# Patient Record
Sex: Male | Born: 2000 | Race: White | Hispanic: No | Marital: Single | Smoking: Never smoker
Health system: Southern US, Community
[De-identification: ages and names within clinical notes are randomized; demographics above are authoritative.]

## PROBLEM LIST (undated history)

## (undated) DIAGNOSIS — U071 COVID-19: Secondary | ICD-10-CM

## (undated) HISTORY — PX: APPENDECTOMY: SHX54

---
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
## 2023-03-21 NOTE — Progress Notes (Unsigned)
New patient visit  Patient: Marvin Cooke   DOB: 11/26/2000   22 y.o. Male  MRN: [id number] Visit Date: 03/25/2023  Today's healthcare provider: Debera Lat, PA-C   Chief Complaint  Patient presents with   New Patient (Initial Visit)    New patient , having issues concentrating , going for a couple month that he has notice    Subjective    Marvin Cooke is a 22 y.o. male who presents today as a new patient to establish care.  Moved back to [place], [place] form   Discussed the use of AI scribe software for clinical note transcription with the patient, who gave verbal consent to proceed.  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 He reports a history of ADHD diagnosed in childhood and depression diagnosed in 2021. He has previously been on medication for these conditions, including Atarax and Zoloft, but stopped taking them when he moved to [place]. He reports significant improvement in his mental state since then, stating that he no longer experiences the feelings and issues he had before. However, he acknowledges difficulty with concentration and fast-paced tasks at work, which he attributes to his ADHD. He denies any substance abuse or other medical problems.        03/25/2023   10:06 AM  PHQ9 SCORE ONLY  PHQ-9 Total Score 3      03/25/2023   11:01 AM  GAD 7 : Generalized Anxiety Score  Nervous, Anxious, on Edge 1  Control/stop worrying 0  Worry too much - different things 0  Trouble relaxing 1  Restless 0  Easily annoyed or irritable 0  Afraid - awful might happen 0  Total GAD 7 Score 2  Anxiety Difficulty Not difficult at all       Past Medical History:  Diagnosis Date   COVID-26 Sep 2019   Past Surgical History:  Procedure Laterality Date   APPENDECTOMY     Family Status  Relation Name Status   Mother  Alive   Father  Alive   Sister  Alive  No  partnership data on file   History reviewed. No pertinent family history. Social History   Socioeconomic History   Marital status: Single    Spouse name: Not on file   Number of children: Not on file   Years of education: Not on file   Highest education level: Not on file  Occupational History   Not on file  Tobacco Use   Smoking status: Never   Smokeless tobacco: Never  Vaping Use   Vaping status: Never Used  Substance and Sexual Activity   Alcohol use: Not Currently   Drug use: Not Currently   Sexual activity: Not Currently  Other Topics Concern   Not on file  Social History Narrative   Not on file   Social Determinants of Health   Financial Resource Strain: Not on file  Food Insecurity: Not on file  Transportation Needs: Not on file  Physical Activity: Not on file  Stress: Not on file  Social Connections: Not on file   Outpatient Medications Prior to Visit  Medication Sig   hydrOXYzine (ATARAX/VISTARIL) 25 MG tablet Take 1 tablet (25 mg total) by mouth at bedtime as needed and may repeat dose one time if needed for anxiety (insomnia.). (Patient not taking: Reported on 03/25/2023)   sertraline (ZOLOFT) 100 MG tablet Take 1 tablet (100  mg total) by mouth at bedtime. (Patient not taking: Reported on 03/25/2023)   No facility-administered medications prior to visit.   No Known Allergies  Immunization History  Administered Date(s) Administered   Tdap 09/19/2019    Health Maintenance  Topic Date Due   HPV VACCINES (1 - Male 3-dose series) Never done   HIV Screening  Never done   Hepatitis C Screening  Never done   COVID-19 Vaccine (1 - 2023-24 season) Never done   INFLUENZA VACCINE  04/08/2023   DTaP/Tdap/Td (2 - Td or Tdap) 09/18/2029    Patient Care Team: Clayborne Dana, MD as PCP - General (Pediatrics)  Review of Systems  All other systems reviewed and are negative.  Except see HPI   {Insert previous labs (optional):23779}  {See past labs  Heme   Chem  Endocrine  Serology  Results Review (optional):1}   Objective    BP 115/69   Pulse 76   Ht 5\' 7"  (1.702 m)   Wt 124 lb 8 oz (56.5 kg)   SpO2 99%   BMI 19.50 kg/m  {Insert last BP/Wt (optional):23777}  {See vitals history (optional):1}  Physical Exam Constitutional:      General: He is not in acute distress.    Appearance: Normal appearance. He is not diaphoretic.  HENT:     Head: Normocephalic.  Eyes:     Conjunctiva/sclera: Conjunctivae normal.  Pulmonary:     Effort: Pulmonary effort is normal. No respiratory distress.  Neurological:     Mental Status: He is alert and oriented to person, place, and time. Mental status is at baseline.     Depression Screen    03/25/2023   10:06 AM  PHQ 2/9 Scores  PHQ - 2 Score 0  PHQ- 9 Score 3   No results found for any visits on 03/25/23.  Assessment & Plan        Possible ADHD: Patient reports difficulty with concentration and fast-paced tasks. History of ADHD diagnosis in childhood and previous medication use. Unclear current status of ADHD. See ARSS survey -Refer to [facility] for further evaluation and possible medication management. -Refer to Psychology for specialized ADHD evaluation.  Depression: Chronic? Has a hx of involuntary commitment to hospital for suicidal ideations in 2021. Pt was diagnosed with MDD, MDDPatient reports improvement in mental health symptoms since last depression screen. No current depressive symptoms reported. -Monitor in follow-up.  General Health Maintenance: -Schedule physical exam in two weeks. -Discuss HPV vaccination at next visit. Patient to research and consider.     Possible developmental delay It required to explain more than 2 times every questions on questionnaire, reason for scheduling with psychology and psychiatry, how scheduling will work, how long it will take to be able to see specialist Needs to be assessed for developmental delay?  Encounter to establish  care Welcomed to our clinic Reviewed past medical hx, social hx, family hx and surgical hx Pt advised to send all vaccination records or screening   Return in about 2 weeks (around 04/08/2023) for CPE.    The patient was advised to call back or seek an in-person evaluation if the symptoms worsen or if the condition fails to improve as anticipated.  I discussed the assessment and treatment plan with the patient. The patient was provided an opportunity to ask questions and all were answered. The patient agreed with the plan and demonstrated an understanding of the instructions.  I, Debera Lat, PA-C have reviewed all documentation for this visit. The  documentation on  03/25/23  for the exam, diagnosis, procedures, and orders are all accurate and complete. I spent 45 Minutes caring for his patient today face to face, performing a medically appropriate examination and /or evaluation, counseling and educating the patient on Depression, Anxiety, ADHD, documenting in the record and referring to specialist while explaining why it should be necessary.   Debera Lat, [facility], MMS [facility] [phone] (phone) [phone] (fax)  [facility]

## 2023-03-25 ENCOUNTER — Encounter: Payer: Self-pay | Admitting: Physician Assistant

## 2023-03-25 ENCOUNTER — Ambulatory Visit: Payer: BC Managed Care – PPO | Admitting: Physician Assistant

## 2023-03-25 VITALS — BP 115/69 | HR 76 | Ht 67.0 in | Wt 124.5 lb

## 2023-03-25 DIAGNOSIS — F332 Major depressive disorder, recurrent severe without psychotic features: Secondary | ICD-10-CM | POA: Diagnosis not present

## 2023-03-25 DIAGNOSIS — Z7689 Persons encountering health services in other specified circumstances: Secondary | ICD-10-CM

## 2023-03-25 DIAGNOSIS — F419 Anxiety disorder, unspecified: Secondary | ICD-10-CM

## 2023-03-25 DIAGNOSIS — R41840 Attention and concentration deficit: Secondary | ICD-10-CM

## 2023-03-25 DIAGNOSIS — F3481 Disruptive mood dysregulation disorder: Secondary | ICD-10-CM

## 2023-03-25 DIAGNOSIS — R45851 Suicidal ideations: Secondary | ICD-10-CM

## 2023-03-26 ENCOUNTER — Telehealth: Payer: Self-pay

## 2023-03-26 NOTE — Telephone Encounter (Signed)
Called and lvm  detailed vm on pt's mother  regarding referral informed pt's mother that  referral was sent to [facility] regarding this. However if  she notices any of the weekend  to take him to ED  regarding this or to call 911.

## 2023-03-26 NOTE — Telephone Encounter (Signed)
Copied from CRM [id number]. Topic: General - Other >> Mar 26, 2023 11:49 AM Turkey B wrote: Reason for CRM: pt's mother called in about referral. I see notes about referring him to [facility] for [facility]. Will this also be for the suicidal thoughts also?  Please call back with status. She says [facility] never received the fax.

## 2023-03-27 DIAGNOSIS — R41840 Attention and concentration deficit: Secondary | ICD-10-CM | POA: Insufficient documentation

## 2023-03-27 DIAGNOSIS — F419 Anxiety disorder, unspecified: Secondary | ICD-10-CM | POA: Insufficient documentation

## 2023-04-08 ENCOUNTER — Encounter: Payer: Self-pay | Admitting: Physician Assistant

## 2023-04-08 DIAGNOSIS — F322 Major depressive disorder, single episode, severe without psychotic features: Secondary | ICD-10-CM | POA: Diagnosis not present

## 2023-04-14 DIAGNOSIS — F322 Major depressive disorder, single episode, severe without psychotic features: Secondary | ICD-10-CM | POA: Diagnosis not present

## 2023-04-14 DIAGNOSIS — F900 Attention-deficit hyperactivity disorder, predominantly inattentive type: Secondary | ICD-10-CM | POA: Diagnosis not present

## 2023-04-17 NOTE — Progress Notes (Unsigned)
Complete physical exam  Patient: Marvin Cooke   DOB: 2000-11-10   22 y.o. Male  MRN: [id number] Visit Date: 04/21/2023  Today's healthcare provider: Debera Lat, PA-C   No chief complaint on file.  Subjective    Marvin Cooke is a 22 y.o. male who presents today for a complete physical exam.  He reports consuming a {diet types:17450} diet. {Exercise:19826} He generally feels {well/fairly well/poorly:18703}. He reports sleeping {well/fairly well/poorly:18703}. He {does/does not:200015} have additional problems to discuss today.  HPI  *** Discussed the use of AI scribe software for clinical note transcription with the patient, who gave verbal consent to proceed.  History of Present Illness            Last depression screening scores    03/25/2023   10:06 AM  PHQ 2/9 Scores  PHQ - 2 Score 0  PHQ- 9 Score 3   Last fall risk screening    03/25/2023   10:01 AM  Fall Risk   Falls in the past year? 0  Number falls in past yr: 0  Injury with Fall? 0  Risk for fall due to : No Fall Risks  Follow up Falls prevention discussed;Falls evaluation completed   Last Audit-C alcohol use screening    11/15/2019   10:49 AM  Alcohol Use Disorder Test (AUDIT)  1. How often do you have a drink containing alcohol?   3. How often do you have six or more drinks on one occasion?   Alcohol Brief Interventions/Follow-up      Information is confidential and restricted. Go to Review Flowsheets to unlock data.   A score of 3 or more in women, and 4 or more in men indicates increased risk for alcohol abuse, EXCEPT if all of the points are from question 1   Past Medical History:  Diagnosis Date   COVID-26 Sep 2019   Past Surgical History:  Procedure Laterality Date   APPENDECTOMY     Social History   Socioeconomic History   Marital status: Single    Spouse name: Not on file   Number of children: Not on file   Years of education: Not on file   Highest education level:  Not on file  Occupational History   Not on file  Tobacco Use   Smoking status: Never   Smokeless tobacco: Never  Vaping Use   Vaping status: Never Used  Substance and Sexual Activity   Alcohol use: Not Currently   Drug use: Not Currently   Sexual activity: Not Currently  Other Topics Concern   Not on file  Social History Narrative   Not on file   Social Determinants of Health   Financial Resource Strain: Not on file  Food Insecurity: Not on file  Transportation Needs: Not on file  Physical Activity: Not on file  Stress: Not on file  Social Connections: Not on file  Intimate Partner Violence: Not on file   Family Status  Relation Name Status   Mother  Alive   Father  Alive   Sister  Alive  No partnership data on file   No family history on file. No Known Allergies  Patient Care Team: Debera Lat, PA-C as PCP - General (Physician Assistant)   Medications: Outpatient Medications Prior to Visit  Medication Sig   hydrOXYzine (ATARAX/VISTARIL) 25 MG tablet Take 1 tablet (25 mg total) by mouth at bedtime as needed and may repeat dose one time if needed for anxiety (insomnia.). (  Patient not taking: Reported on 03/25/2023)   sertraline (ZOLOFT) 100 MG tablet Take 1 tablet (100 mg total) by mouth at bedtime. (Patient not taking: Reported on 03/25/2023)   No facility-administered medications prior to visit.    Review of Systems  All other systems reviewed and are negative.  Except see HPI  {Insert previous labs (optional):23779} {See past labs  Heme  Chem  Endocrine  Serology  Results Review (optional):1}  Objective    There were no vitals taken for this visit. {Insert last BP/Wt (optional):23777}{See vitals history (optional):1}    Physical Exam Vitals reviewed.  Constitutional:      General: He is not in acute distress.    Appearance: Normal appearance. He is well-developed. He is not ill-appearing, toxic-appearing or diaphoretic.  HENT:     Head:  Normocephalic and atraumatic.     Right Ear: Tympanic membrane, ear canal and external ear normal.     Left Ear: Tympanic membrane, ear canal and external ear normal.     Nose: Nose normal. No congestion or rhinorrhea.     Mouth/Throat:     Mouth: Mucous membranes are moist.     Pharynx: Oropharynx is clear. No oropharyngeal exudate.  Eyes:     General: No scleral icterus.       Right eye: No discharge.        Left eye: No discharge.     Conjunctiva/sclera: Conjunctivae normal.     Pupils: Pupils are equal, round, and reactive to light.  Neck:     Thyroid: No thyromegaly.     Vascular: No carotid bruit.  Cardiovascular:     Rate and Rhythm: Normal rate and regular rhythm.     Pulses: Normal pulses.     Heart sounds: Normal heart sounds. No murmur heard.    No friction rub. No gallop.  Pulmonary:     Effort: Pulmonary effort is normal. No respiratory distress.     Breath sounds: Normal breath sounds. No wheezing or rales.  Abdominal:     General: Abdomen is flat. Bowel sounds are normal. There is no distension.     Palpations: Abdomen is soft. There is no mass.     Tenderness: There is no abdominal tenderness. There is no right CVA tenderness, left CVA tenderness, guarding or rebound.     Hernia: No hernia is present.  Musculoskeletal:        General: No swelling, tenderness, deformity or signs of injury. Normal range of motion.     Cervical back: Normal range of motion and neck supple. No rigidity or tenderness.     Right lower leg: No edema.     Left lower leg: No edema.  Lymphadenopathy:     Cervical: No cervical adenopathy.  Skin:    General: Skin is warm and dry.     Coloration: Skin is not jaundiced or pale.     Findings: No bruising, erythema, lesion or rash.  Neurological:     Mental Status: He is alert and oriented to person, place, and time. Mental status is at baseline.     Gait: Gait normal.  Psychiatric:        Mood and Affect: Mood normal.        Behavior:  Behavior normal.        Thought Content: Thought content normal.        Judgment: Judgment normal.      No results found for any visits on 04/21/23.  Assessment & Plan  Routine Health Maintenance and Physical Exam  Exercise Activities and Dietary recommendations  Goals   None     Immunization History  Administered Date(s) Administered   Tdap 09/19/2019    Health Maintenance  Topic Date Due   HPV Vaccine (1 - Male 3-dose series) Never done   HIV Screening  Never done   Hepatitis C Screening  Never done   COVID-19 Vaccine (1 - 2023-24 season) Never done   Flu Shot  04/08/2023   DTaP/Tdap/Td vaccine (2 - Td or Tdap) 09/18/2029    Discussed health benefits of physical activity, and encouraged him to engage in regular exercise appropriate for his age and condition. Annual physical exam UTD on dental/eye Things to do to keep yourself healthy  - Exercise at least 30-45 minutes a day, 3-4 days a week.  - Eat a low-fat diet with lots of fruits and vegetables, up to 7-9 servings per day.  - Seatbelts can save your life. Wear them always.  - Smoke detectors on every level of your home, check batteries every year.  - Eye Doctor - have an eye exam every 1-2 years  - Safe sex - if you may be exposed to STDs, use a condom.  - Alcohol -  If you drink, do it moderately, less than 2 drinks per day.  - Health Care Power of Attorney. Choose someone to speak for you if you are not able.  - Depression is common in our stressful world.If you're feeling down or losing interest in things you normally enjoy, please come in for a visit.  - Violence - If anyone is threatening or hurting you, please call immediately.   ***  No follow-ups on file.    The patient was advised to call back or seek an in-person evaluation if the symptoms worsen or if the condition fails to improve as anticipated.  I discussed the assessment and treatment plan with the patient. The patient was provided an  opportunity to ask questions and all were answered. The patient agreed with the plan and demonstrated an understanding of the instructions.  I, Debera Lat, PA-C have reviewed all documentation for this visit. The documentation on  04/21/23  for the exam, diagnosis, procedures, and orders are all accurate and complete.  Debera Lat, [facility], MMS [facility] [phone] (phone) [phone] (fax)  [facility]

## 2023-04-21 ENCOUNTER — Encounter: Payer: Self-pay | Admitting: Physician Assistant

## 2023-04-21 ENCOUNTER — Ambulatory Visit (INDEPENDENT_AMBULATORY_CARE_PROVIDER_SITE_OTHER): Payer: BC Managed Care – PPO | Admitting: Physician Assistant

## 2023-04-21 VITALS — HR 83 | Temp 98.0°F | Resp 12 | Ht 67.0 in | Wt 125.6 lb

## 2023-04-21 DIAGNOSIS — Z0000 Encounter for general adult medical examination without abnormal findings: Secondary | ICD-10-CM

## 2023-04-29 DIAGNOSIS — F411 Generalized anxiety disorder: Secondary | ICD-10-CM | POA: Diagnosis not present

## 2023-04-29 DIAGNOSIS — F322 Major depressive disorder, single episode, severe without psychotic features: Secondary | ICD-10-CM | POA: Diagnosis not present

## 2023-04-29 DIAGNOSIS — F900 Attention-deficit hyperactivity disorder, predominantly inattentive type: Secondary | ICD-10-CM | POA: Diagnosis not present

## 2023-06-07 ENCOUNTER — Encounter: Payer: Self-pay | Admitting: Physician Assistant

## 2023-06-08 ENCOUNTER — Encounter: Payer: Self-pay | Admitting: Physician Assistant

## 2023-06-08 ENCOUNTER — Telehealth (INDEPENDENT_AMBULATORY_CARE_PROVIDER_SITE_OTHER): Payer: BC Managed Care – PPO | Admitting: Physician Assistant

## 2023-06-08 DIAGNOSIS — R631 Polydipsia: Secondary | ICD-10-CM

## 2023-06-08 DIAGNOSIS — R350 Frequency of micturition: Secondary | ICD-10-CM | POA: Diagnosis not present

## 2023-06-08 DIAGNOSIS — N3941 Urge incontinence: Secondary | ICD-10-CM | POA: Diagnosis not present

## 2023-06-08 NOTE — Progress Notes (Signed)
MyChart Video Visit  Virtual Visit via Video Note   This format is felt to be most appropriate for this patient at this time. Physical exam was limited by quality of the video and audio technology used for the visit.   Patient location: office Patient Location: Home  I discussed the limitations of evaluation and management by telemedicine and the availability of in person appointments. The patient expressed understanding and agreed to proceed.  Patient: Marvin Cooke   DOB: 2000-12-30   22 y.o. Male  MRN: [id number] Visit Date: 06/08/2023  Today's healthcare provider: Debera Lat, PA-C   Chief Complaint  Patient presents with   Acute Visit   Subjective     Discussed the use of AI scribe software for clinical note transcription with the patient, who gave verbal consent to proceed.  History of Present Illness   The patient, with a history of depression, presents with a two-week history of increased thirst and frequent urination. He denies a family history of diabetes and a diet high in sugar. He denies dysuria and abdominal pain. He has been working out more frequently at the gym and drinking more water as a result. He reports urinary incontinence, specifically an inability to reach the bathroom in time. He denies cough-induced leakage. He denies fever, flank pain, hematuria, abnormal urine color, discharge, and rash in the groin area. He has been under increased stress at work.      Medications: No outpatient medications prior to visit.   No facility-administered medications prior to visit.    Review of Systems  All other systems reviewed and are negative.  Except see HPI       Objective    There were no vitals taken for this visit.      Physical Exam Constitutional:      General: He is not in acute distress.    Appearance: Normal appearance. He is not diaphoretic.  HENT:     Head: Normocephalic.  Eyes:     Conjunctiva/sclera: Conjunctivae normal.   Pulmonary:     Effort: Pulmonary effort is normal. No respiratory distress.  Neurological:     Mental Status: He is alert and oriented to person, place, and time. Mental status is at baseline.        Assessment & Plan    Increased urinary frequency  Increased thirst Polydipsia and Polyuria Increased water intake and urinary frequency for the past two weeks. No family history of diabetes, no recent changes in diet or medication, no pain or burning with urination, no fever, no abdominal pain. Patient has been exercising more recently. -Order urinalysis and urine culture to rule out urinary tract infection. -Order blood work including fasting blood glucose and HbA1c to rule out diabetes. -Check kidney function. - Comprehensive metabolic panel - Urine Culture - Hemoglobin A1c - Urinalysis, Routine w reflex microscopic     Urge incontinence of urine Acute problem Unclear if it is due to increased stress? Patient reports occasional leakage, particularly when unable to reach the bathroom in time. -If labs are normal, consider referral to urology for further evaluation.  Stress Patient reports stress related to work. -Continue to monitor and provide support as needed.    Recheck his phq9 and gad7 at his next visit after checking his lab results   No follow-ups on file.     I discussed the assessment and treatment plan with the patient. The patient was provided an opportunity to ask questions and all were answered. The patient agreed with  the plan and demonstrated an understanding of the instructions.   The patient was advised to call back or seek an in-person evaluation if the symptoms worsen or if the condition fails to improve as anticipated.  I provided 21 minutes of non-face-to-face time during this encounter.  I, Debera Lat, PA-C have reviewed all documentation for this visit. The documentation on  06/08/23 for the exam, diagnosis, procedures, and orders are all accurate  and complete.  Debera Lat, [facility], MMS [facility] [phone] (phone) [phone] (fax)  [facility]

## 2023-06-09 DIAGNOSIS — R631 Polydipsia: Secondary | ICD-10-CM | POA: Diagnosis not present

## 2023-06-09 DIAGNOSIS — R350 Frequency of micturition: Secondary | ICD-10-CM | POA: Diagnosis not present

## 2023-06-09 DIAGNOSIS — N3941 Urge incontinence: Secondary | ICD-10-CM | POA: Diagnosis not present

## 2023-06-11 LAB — URINALYSIS, ROUTINE W REFLEX MICROSCOPIC
Bilirubin, UA: NEGATIVE
Glucose, UA: NEGATIVE
Ketones, UA: NEGATIVE
Leukocytes,UA: NEGATIVE
Nitrite, UA: NEGATIVE
Protein,UA: NEGATIVE
RBC, UA: NEGATIVE
Specific Gravity, UA: 1.012 (ref 1.005–1.030)
Urobilinogen, Ur: 0.2 mg/dL (ref 0.2–1.0)
pH, UA: 6 (ref 5.0–7.5)

## 2023-06-11 LAB — COMPREHENSIVE METABOLIC PANEL
ALT: 87 [IU]/L — ABNORMAL HIGH (ref 0–44)
AST: 114 [IU]/L — ABNORMAL HIGH (ref 0–40)
Albumin: 4.5 g/dL (ref 4.3–5.2)
Alkaline Phosphatase: 67 [IU]/L (ref 51–125)
BUN/Creatinine Ratio: 17 (ref 9–20)
BUN: 17 mg/dL (ref 6–20)
Bilirubin Total: 0.7 mg/dL (ref 0.0–1.2)
CO2: 25 mmol/L (ref 20–29)
Calcium: 9.5 mg/dL (ref 8.7–10.2)
Chloride: 99 mmol/L (ref 96–106)
Creatinine, Ser: 1.01 mg/dL (ref 0.76–1.27)
Globulin, Total: 2.4 g/dL (ref 1.5–4.5)
Glucose: 83 mg/dL (ref 70–99)
Potassium: 4.5 mmol/L (ref 3.5–5.2)
Sodium: 138 mmol/L (ref 134–144)
Total Protein: 6.9 g/dL (ref 6.0–8.5)
eGFR: 109 mL/min/{1.73_m2} (ref >59)

## 2023-06-11 LAB — URINE CULTURE: Organism ID, Bacteria: NO GROWTH

## 2023-06-11 LAB — HEMOGLOBIN A1C
Est. average glucose Bld gHb Est-mCnc: 100 mg/dL
Hgb A1c MFr Bld: 5.1 % (ref 4.8–5.6)

## 2023-06-18 ENCOUNTER — Encounter: Payer: Self-pay | Admitting: Physician Assistant

## 2023-06-18 DIAGNOSIS — F900 Attention-deficit hyperactivity disorder, predominantly inattentive type: Secondary | ICD-10-CM | POA: Diagnosis not present

## 2023-06-18 DIAGNOSIS — F322 Major depressive disorder, single episode, severe without psychotic features: Secondary | ICD-10-CM | POA: Diagnosis not present

## 2023-06-18 DIAGNOSIS — F411 Generalized anxiety disorder: Secondary | ICD-10-CM | POA: Diagnosis not present

## 2023-08-02 DIAGNOSIS — F411 Generalized anxiety disorder: Secondary | ICD-10-CM | POA: Diagnosis not present

## 2023-08-02 DIAGNOSIS — F322 Major depressive disorder, single episode, severe without psychotic features: Secondary | ICD-10-CM | POA: Diagnosis not present

## 2023-08-02 DIAGNOSIS — F900 Attention-deficit hyperactivity disorder, predominantly inattentive type: Secondary | ICD-10-CM | POA: Diagnosis not present

## 2023-09-28 DIAGNOSIS — F322 Major depressive disorder, single episode, severe without psychotic features: Secondary | ICD-10-CM | POA: Diagnosis not present

## 2023-09-28 DIAGNOSIS — F411 Generalized anxiety disorder: Secondary | ICD-10-CM | POA: Diagnosis not present

## 2023-09-28 DIAGNOSIS — F900 Attention-deficit hyperactivity disorder, predominantly inattentive type: Secondary | ICD-10-CM | POA: Diagnosis not present

## 2023-10-15 ENCOUNTER — Ambulatory Visit: Payer: BC Managed Care – PPO | Admitting: Physician Assistant

## 2023-10-15 ENCOUNTER — Encounter: Payer: Self-pay | Admitting: Physician Assistant

## 2023-10-15 VITALS — BP 118/62 | HR 96 | Wt 135.9 lb

## 2023-10-15 DIAGNOSIS — R051 Acute cough: Secondary | ICD-10-CM | POA: Diagnosis not present

## 2023-10-15 NOTE — Progress Notes (Signed)
 Established patient visit  Patient: Marvin Cooke   DOB: 12/02/2000   23 y.o. Male  MRN: [id number] Visit Date: 10/15/2023  Today's healthcare provider: Jolynn Spencer, PA-C   Chief Complaint  Patient presents with   Cough    Symptoms started Tuesday night as severe hiccups, sore throat, chills and cough with phlegm. Patient reports phlegm is yellowish-green color. Not aware of any fever. Reports he has also had a headache for the last few day.  He reports taking mucinex and Nyquil. Patient reports he has not tested for covid at home.  Reports main symptom is his headache and chills   Subjective       Discussed the use of AI scribe software for clinical note transcription with the patient, who gave verbal consent to proceed.  History of Present Illness   The patient presents with a 4-day history of severe hiccups, sore throat, chills, cough with phlegm, and headache. He has been self-treating with Mucinex DM and Nightquil. He denies fever and ear problems. He reports a recent negative COVID test. The patient's main symptoms at the time of the visit are cough and mucus production. He also reports a painful finger due to constant use.           04/21/2023    4:09 PM 04/21/2023    4:08 PM 03/25/2023   10:06 AM  Depression screen PHQ 2/9  Decreased Interest 0 0 0  Down, Depressed, Hopeless 0 0 0  PHQ - 2 Score 0 0 0  Altered sleeping 0 0 0  Tired, decreased energy 0 0 0  Change in appetite 0 0 0  Feeling bad or failure about yourself  0    Trouble concentrating 1 1 3   Moving slowly or fidgety/restless 0 0 0  Suicidal thoughts 0 0 0  PHQ-9 Score 1 1 3   Difficult doing work/chores Not difficult at all Not difficult at all Somewhat difficult      03/25/2023   11:01 AM  GAD 7 : Generalized Anxiety Score  Nervous, Anxious, on Edge 1  Control/stop worrying 0  Worry too much - different things 0  Trouble relaxing 1  Restless 0  Easily annoyed or irritable 0  Afraid - awful  might happen 0  Total GAD 7 Score 2  Anxiety Difficulty Not difficult at all    Medications: No outpatient medications prior to visit.   No facility-administered medications prior to visit.    Review of Systems All negative Except see HPI       Objective    BP 118/62 (BP Location: Right Arm, Patient Position: Sitting, Cuff Size: Normal)   Pulse 96   Wt 135 lb 14.4 oz (61.6 kg)   SpO2 96%   BMI 21.28 kg/m     Physical Exam Vitals reviewed.  Constitutional:      General: He is in acute distress.     Appearance: Normal appearance. He is not ill-appearing, toxic-appearing or diaphoretic.  HENT:     Head: Normocephalic and atraumatic.     Right Ear: Ear canal and external ear normal. There is impacted cerumen (partially).     Left Ear: Ear canal and external ear normal. There is impacted cerumen (partially).     Nose: Congestion and rhinorrhea present.     Mouth/Throat:     Pharynx: Posterior oropharyngeal erythema present.  Eyes:     General: No scleral icterus.       Right eye: No discharge.  Left eye: No discharge.     Extraocular Movements: Extraocular movements intact.     Conjunctiva/sclera: Conjunctivae normal.     Pupils: Pupils are equal, round, and reactive to light.  Cardiovascular:     Rate and Rhythm: Normal rate and regular rhythm.     Pulses: Normal pulses.     Heart sounds: Normal heart sounds. No murmur heard. Pulmonary:     Effort: Pulmonary effort is normal. No respiratory distress.     Breath sounds: Normal breath sounds. No wheezing or rhonchi.  Abdominal:     General: Abdomen is flat. Bowel sounds are normal.     Palpations: Abdomen is soft.  Musculoskeletal:        General: Normal range of motion.     Cervical back: Normal range of motion and neck supple.     Right lower leg: No edema.     Left lower leg: No edema.  Lymphadenopathy:     Cervical: No cervical adenopathy.  Skin:    General: Skin is warm and dry.     Findings: No  rash.  Neurological:     General: No focal deficit present.     Mental Status: He is alert and oriented to person, place, and time. Mental status is at baseline.  Psychiatric:        Behavior: Behavior normal.        Thought Content: Thought content normal.      No results found for any visits on 10/15/23.      Assessment and Plan    Upper Respiratory Infection Symptoms of cough with phlegm, sore throat, chills, and headache for 4 days. No fever. COVID-19 test negative. Lungs clear on auscultation. -Continue Mucinex DM for cough. -Use nasal saline rinse for nasal congestion. -Consider Allegra, Claritin, or Zyrtec for antihistamine. -Consider Flonase if nasal congestion worsens. -Use warm salt gargle for sore throat. -Drink plenty of water and hot tea with honey. -Use Tylenol or ibuprofen with meals for fever or chills. -If symptoms worsen, contact primary care provider for further evaluation and possible prescription medication.  Follow-up Monitor symptoms and contact primary care provider if symptoms worsen.     Orders Placed This Encounter  Procedures   POC Covid19/Flu A&B Antigen    No follow-ups on file.   The patient was advised to call back or seek an in-person evaluation if the symptoms worsen or if the condition fails to improve as anticipated.  I discussed the assessment and treatment plan with the patient. The patient was provided an opportunity to ask questions and all were answered. The patient agreed with the plan and demonstrated an understanding of the instructions.  I, Arkie Tagliaferro, PA-C have reviewed all documentation for this visit. The documentation on 10/15/2023  for the exam, diagnosis, procedures, and orders are all accurate and complete.  Jolynn Spencer, [facility], MMS [facility] [phone] (phone) [phone] (fax)  [facility]

## 2023-10-16 LAB — POC COVID19/FLU A&B COMBO

## 2024-02-10 DIAGNOSIS — H9191 Unspecified hearing loss, right ear: Secondary | ICD-10-CM | POA: Diagnosis not present

## 2024-02-10 DIAGNOSIS — H6121 Impacted cerumen, right ear: Secondary | ICD-10-CM | POA: Diagnosis not present

## 2024-04-15 NOTE — Progress Notes (Signed)
 Complete physical exam  Patient: Marvin Cooke   DOB: 03-28-2001   23 y.o. Male  MRN: [id number] Visit Date: 04/21/2024  Today's healthcare provider: Jolynn Spencer, PA-C   Chief Complaint  Patient presents with   Annual Exam    Diet - General Exercise - when time allows for at least a hour but no set regimen Feeling - well Sleeping - well Concerns - none   Subjective    Marvin Cooke is a 23 y.o. male who presents today for a complete physical exam.   HPI     Annual Exam    Additional comments: Diet - General Exercise - when time allows for at least a hour but no set regimen Feeling - well Sleeping - well Concerns - none      Last edited by Lilian Fitzpatrick, CMA on 04/21/2024  9:26 AM.      Discussed the use of AI scribe software for clinical note transcription with the patient, who gave verbal consent to proceed.  History of Present Illness Marvin Cooke is a 23 year old male who presents with allergy symptoms and an annual physical exam .  He experiences nasal congestion and mucus drainage since returning from the beach. He uses over-the-counter medications like Decara and plans to take DayQuil. No ear pain, hearing issues, or throat lumps are present.  A year ago, he underwent an ADHD evaluation with positive results. He tried medication without benefit and discontinued it. He experiences recent work-related anxiety but considers it normal. He is considering autism testing at a facility in [place] and plans to send records to his medical provider.  He is not comfortable with vaccinations unless necessary and has not received any recent vaccines. He sells plasma and undergoes regular blood work, including blood pressure and finger pricks, during these sessions.    Last depression screening scores    04/21/2023    4:09 PM 04/21/2023    4:08 PM 03/25/2023   10:06 AM  PHQ 2/9 Scores  PHQ - 2 Score 0 0 0  PHQ- 9 Score 1 1 3    Last fall risk screening     04/21/2023    4:08 PM  Fall Risk   Falls in the past year? 0  Number falls in past yr: 0  Injury with Fall? 0  Risk for fall due to : No Fall Risks  Follow up Falls evaluation completed   Last Audit-C alcohol use screening    11/15/2019   10:49 AM  Alcohol Use Disorder Test (AUDIT)  1. How often do you have a drink containing alcohol?   3. How often do you have six or more drinks on one occasion?   Alcohol Brief Interventions/Follow-up      Information is confidential and restricted. Go to Review Flowsheets to unlock data.   A score of 3 or more in women, and 4 or more in men indicates increased risk for alcohol abuse, EXCEPT if all of the points are from question 1   Past Medical History:  Diagnosis Date   COVID-26 Sep 2019   Past Surgical History:  Procedure Laterality Date   APPENDECTOMY     Social History   Socioeconomic History   Marital status: Single    Spouse name: Not on file   Number of children: Not on file   Years of education: Not on file   Highest education level: Not on file  Occupational History   Not on file  Tobacco Use   Smoking status: Never   Smokeless tobacco: Never  Vaping Use   Vaping status: Never Used  Substance and Sexual Activity   Alcohol use: Not Currently   Drug use: Not Currently   Sexual activity: Not Currently  Other Topics Concern   Not on file  Social History Narrative   Not on file   Social Drivers of Health   Financial Resource Strain: Not on file  Food Insecurity: Not on file  Transportation Needs: Not on file  Physical Activity: Not on file  Stress: Not on file  Social Connections: Not on file  Intimate Partner Violence: Not on file   Family Status  Relation Name Status   Mother  Alive   Father  Alive   Sister  Alive  No partnership data on file   History reviewed. No pertinent family history. No Known Allergies  Patient Care Team: Osceola Holian, PA-C as PCP - General (Physician Assistant)    Medications: No outpatient medications prior to visit.   No facility-administered medications prior to visit.    Review of Systems  All other systems reviewed and are negative.  Except see HPI     Objective    BP (!) 114/54 (BP Location: Left Arm, Patient Position: Sitting, Cuff Size: Normal)   Pulse 73   Ht 5' 7 (1.702 m)   Wt 137 lb 14.4 oz (62.6 kg)   SpO2 100%   BMI 21.60 kg/m      Physical Exam Vitals reviewed.  Constitutional:      General: He is not in acute distress.    Appearance: Normal appearance. He is well-developed. He is not ill-appearing, toxic-appearing or diaphoretic.  HENT:     Head: Normocephalic and atraumatic.     Right Ear: Tympanic membrane, ear canal and external ear normal.     Left Ear: Tympanic membrane, ear canal and external ear normal.     Nose: Nose normal. No congestion or rhinorrhea.     Mouth/Throat:     Mouth: Mucous membranes are moist.     Pharynx: Oropharynx is clear. No oropharyngeal exudate.  Eyes:     General: No scleral icterus.       Right eye: No discharge.        Left eye: No discharge.     Conjunctiva/sclera: Conjunctivae normal.     Pupils: Pupils are equal, round, and reactive to light.  Neck:     Thyroid: No thyromegaly.     Vascular: No carotid bruit.  Cardiovascular:     Rate and Rhythm: Normal rate and regular rhythm.     Pulses: Normal pulses.     Heart sounds: Normal heart sounds. No murmur heard.    No friction rub. No gallop.  Pulmonary:     Effort: Pulmonary effort is normal. No respiratory distress.     Breath sounds: Normal breath sounds. No wheezing or rales.  Abdominal:     General: Abdomen is flat. Bowel sounds are normal. There is no distension.     Palpations: Abdomen is soft. There is no mass.     Tenderness: There is no abdominal tenderness. There is no right CVA tenderness, left CVA tenderness, guarding or rebound.     Hernia: No hernia is present.  Musculoskeletal:        General:  No swelling, tenderness, deformity or signs of injury. Normal range of motion.     Cervical back: Normal range of motion and neck supple. No rigidity or  tenderness.     Right lower leg: No edema.     Left lower leg: No edema.  Lymphadenopathy:     Cervical: No cervical adenopathy.  Skin:    General: Skin is warm and dry.     Coloration: Skin is not jaundiced or pale.     Findings: No bruising, erythema, lesion or rash.  Neurological:     Mental Status: He is alert and oriented to person, place, and time. Mental status is at baseline.     Gait: Gait normal.  Psychiatric:        Mood and Affect: Mood normal.        Behavior: Behavior normal.        Thought Content: Thought content normal.        Judgment: Judgment normal.      No results found for any visits on 04/21/24.  Assessment & Plan    Routine Health Maintenance and Physical Exam  Exercise Activities and Dietary recommendations  Goals   None     Immunization History  Administered Date(s) Administered   DTaP 06/01/2003, 08/03/2003, 10/03/2003, 04/09/2005, 06/08/2007   HIB, Unspecified 06/06/2003, 08/03/2003, 04/04/2004   Hepatitis A 08/08/2013   Hepatitis B, ADULT 09[birth year]10[birth year]02, 06/06/2003, 08/09/2003   IPV 06/06/2003, 08/03/2003, 10/03/2003, 06/08/2007   MMR 04/09/2005, 06/08/2007   Meningococcal Mcv4o 04/25/2015   Pneumococcal-Unspecified 06/06/2003, 08/03/2003, 10/03/2003   Tdap 10/08/2014, 09/19/2019   Varicella 04/04/2004, 06/08/2007    Health Maintenance  Topic Date Due   HIV Screening  Never done   Hepatitis C Screening  Never done   Flu Shot  04/07/2024   COVID-19 Vaccine (1 - 2024-25 season) 05/07/2024*   Hepatitis B Vaccine (4 of 4 - 4-dose series) 04/21/2025*   HPV Vaccine (1 - Male 3-dose series) 04/21/2025*   Meningitis B Vaccine (1 of 2 - Standard) 04/21/2025*   DTaP/Tdap/Td vaccine (8 - Td or Tdap) 09/18/2029   Pneumococcal Vaccine  Aged Out  *Topic was postponed. The date shown is not the  original due date.    Discussed health benefits of physical activity, and encouraged him to engage in regular exercise appropriate for his age and condition.  Assessment & Plan Allergic rhinitis Intermittent symptoms likely due to allergen exposure. Took antihistamines, specifically Decara, to help with allergies. - Recommend Zyrtec over-the-counter.  Attention-deficit hyperactivity disorder Previously tested positive for ADHD. Medication not helpful, open to future support. - Offer referral to psychiatry or counseling if desired. Patient declined for now  Anxiety and depression    04/21/2023    4:09 PM 04/21/2023    4:08 PM 03/25/2023   10:06 AM  PHQ9 SCORE ONLY  PHQ-9 Total Score 1 1 3       03/25/2023   11:01 AM  GAD 7 : Generalized Anxiety Score  Nervous, Anxious, on Edge 1  Control/stop worrying 0  Worry too much - different things 0  Trouble relaxing 1  Restless 0  Easily annoyed or irritable 0  Afraid - awful might happen 0  Total GAD 7 Score 2  Anxiety Difficulty Not difficult at all    Recent anxiety due to work stress.  Not currently on medication Will reassess at the follow-up  Plasma donation monitoring Regular plasma donation with no symptoms of liver dysfunction or anemia. Previous liver enzyme issues noted. - Order hemoglobin test. - Order liver enzyme tests. Will follow-up  Annual physical exam (Primary) Needs annual dental/eye exams Things to do to keep yourself healthy  - Exercise at least  30-45 minutes a day, 3-4 days a week.  - Eat a low-fat diet with lots of fruits and vegetables, up to 7-9 servings per day.  - Seatbelts can save your life. Wear them always.  - Smoke detectors on every level of your home, check batteries every year.  - Eye Doctor - have an eye exam every 1-2 years  - Safe sex - if you may be exposed to STDs, use a condom.  - Alcohol -  If you drink, do it moderately, less than 2 drinks per day.  - Health Care Power of  Attorney. Choose someone to speak for you if you are not able.  - Depression is common in our stressful world.If you're feeling down or losing interest in things you normally enjoy, please come in for a visit.  - Violence - If anyone is threatening or hurting you, please call immediately.  Donor of blood product  - CBC with Differential/Platelet - Comprehensive metabolic panel with GFR  Need for hepatitis C screening test  - Hepatitis C antibody  Encounter for screening for HIV  - HIV Antibody (routine testing w rflx)  Screening for endocrine, nutritional, metabolic and immunity disorder Cbc, cmp  Need for hepatitis B screening test  - Hepatitis B surface antibody,quantitative  Elevated liver enzymes In the past, we will repeat CMP If liver enzymes are still elevated we will recheck acute hepatitis panel  Return in about 1 year (around 04/21/2025) for CPE.    The patient was advised to call back or seek an in-person evaluation if the symptoms worsen or if the condition fails to improve as anticipated.  I discussed the assessment and treatment plan with the patient. The patient was provided an opportunity to ask questions and all were answered. The patient agreed with the plan and demonstrated an understanding of the instructions.  I, Kewan Mcnease, PA-C have reviewed all documentation for this visit. The documentation on 04/21/2024  for the exam, diagnosis, procedures, and orders are all accurate and complete.  Jolynn Spencer, [facility], MMS [facility] [phone] (phone) [phone] (fax)  [facility]

## 2024-04-21 ENCOUNTER — Encounter: Payer: Self-pay | Admitting: Physician Assistant

## 2024-04-21 ENCOUNTER — Ambulatory Visit: Payer: Self-pay | Admitting: Physician Assistant

## 2024-04-21 VITALS — BP 114/54 | HR 73 | Ht 67.0 in | Wt 137.9 lb

## 2024-04-21 DIAGNOSIS — Z0000 Encounter for general adult medical examination without abnormal findings: Secondary | ICD-10-CM

## 2024-04-21 DIAGNOSIS — R748 Abnormal levels of other serum enzymes: Secondary | ICD-10-CM

## 2024-04-21 DIAGNOSIS — Z114 Encounter for screening for human immunodeficiency virus [HIV]: Secondary | ICD-10-CM

## 2024-04-21 DIAGNOSIS — Z52000 Unspecified donor, whole blood: Secondary | ICD-10-CM | POA: Diagnosis not present

## 2024-04-21 DIAGNOSIS — Z1159 Encounter for screening for other viral diseases: Secondary | ICD-10-CM

## 2024-04-21 DIAGNOSIS — Z0184 Encounter for antibody response examination: Secondary | ICD-10-CM

## 2024-04-21 DIAGNOSIS — Z130 Encounter for screening for diseases of the blood and blood-forming organs and certain disorders involving the immune mechanism: Secondary | ICD-10-CM

## 2024-04-21 DIAGNOSIS — Z13228 Encounter for screening for other metabolic disorders: Secondary | ICD-10-CM

## 2024-04-21 DIAGNOSIS — Z1321 Encounter for screening for nutritional disorder: Secondary | ICD-10-CM

## 2024-04-21 DIAGNOSIS — Z1329 Encounter for screening for other suspected endocrine disorder: Secondary | ICD-10-CM

## 2024-04-22 LAB — CBC WITH DIFFERENTIAL/PLATELET
Basophils Absolute: 0 x10E3/uL (ref 0.0–0.2)
Basos: 1 %
EOS (ABSOLUTE): 0.4 x10E3/uL (ref 0.0–0.4)
Eos: 5 %
Hematocrit: 46.4 % (ref 37.5–51.0)
Hemoglobin: 15.2 g/dL (ref 13.0–17.7)
Immature Grans (Abs): 0 x10E3/uL (ref 0.0–0.1)
Immature Granulocytes: 0 %
Lymphocytes Absolute: 1.7 x10E3/uL (ref 0.7–3.1)
Lymphs: 23 %
MCH: 30.4 pg (ref 26.6–33.0)
MCHC: 32.8 g/dL (ref 31.5–35.7)
MCV: 93 fL (ref 79–97)
Monocytes Absolute: 0.7 x10E3/uL (ref 0.1–0.9)
Monocytes: 9 %
Neutrophils Absolute: 4.7 x10E3/uL (ref 1.4–7.0)
Neutrophils: 62 %
Platelets: 219 x10E3/uL (ref 150–450)
RBC: 5 x10E6/uL (ref 4.14–5.80)
RDW: 12.5 % (ref 11.6–15.4)
WBC: 7.5 x10E3/uL (ref 3.4–10.8)

## 2024-04-22 LAB — COMPREHENSIVE METABOLIC PANEL WITH GFR
ALT: 32 IU/L (ref 0–44)
AST: 33 IU/L (ref 0–40)
Albumin: 4.9 g/dL (ref 4.3–5.2)
Alkaline Phosphatase: 67 IU/L (ref 44–121)
BUN/Creatinine Ratio: 20 (ref 9–20)
BUN: 21 mg/dL — ABNORMAL HIGH (ref 6–20)
Bilirubin Total: 0.4 mg/dL (ref 0.0–1.2)
CO2: 21 mmol/L (ref 20–29)
Calcium: 9.7 mg/dL (ref 8.7–10.2)
Chloride: 103 mmol/L (ref 96–106)
Creatinine, Ser: 1.07 mg/dL (ref 0.76–1.27)
Globulin, Total: 2.5 g/dL (ref 1.5–4.5)
Glucose: 75 mg/dL (ref 70–99)
Potassium: 4.3 mmol/L (ref 3.5–5.2)
Sodium: 142 mmol/L (ref 134–144)
Total Protein: 7.4 g/dL (ref 6.0–8.5)
eGFR: 101 mL/min/1.73 (ref >59)

## 2024-04-22 LAB — HEPATITIS C ANTIBODY: Hep C Virus Ab: NONREACTIVE

## 2024-04-22 LAB — HIV ANTIBODY (ROUTINE TESTING W REFLEX): HIV Screen 4th Generation wRfx: NONREACTIVE

## 2024-04-24 ENCOUNTER — Ambulatory Visit: Payer: Self-pay | Admitting: Physician Assistant

## 2024-05-16 ENCOUNTER — Encounter: Payer: Self-pay | Admitting: Physician Assistant

## 2024-06-09 ENCOUNTER — Ambulatory Visit: Admitting: Physician Assistant

## 2024-06-20 DIAGNOSIS — F900 Attention-deficit hyperactivity disorder, predominantly inattentive type: Secondary | ICD-10-CM | POA: Diagnosis not present

## 2024-06-20 DIAGNOSIS — F411 Generalized anxiety disorder: Secondary | ICD-10-CM | POA: Diagnosis not present

## 2024-06-20 DIAGNOSIS — F322 Major depressive disorder, single episode, severe without psychotic features: Secondary | ICD-10-CM | POA: Diagnosis not present

## 2024-07-21 DIAGNOSIS — F900 Attention-deficit hyperactivity disorder, predominantly inattentive type: Secondary | ICD-10-CM | POA: Diagnosis not present

## 2024-07-21 DIAGNOSIS — F411 Generalized anxiety disorder: Secondary | ICD-10-CM | POA: Diagnosis not present

## 2024-07-21 DIAGNOSIS — F322 Major depressive disorder, single episode, severe without psychotic features: Secondary | ICD-10-CM | POA: Diagnosis not present

## 2024-09-26 ENCOUNTER — Encounter: Payer: Self-pay | Admitting: Physician Assistant

## 2025-04-24 ENCOUNTER — Encounter: Admitting: Physician Assistant
